# Patient Record
Sex: Female | Born: 2000
Health system: Southern US, Community
[De-identification: ages and names within clinical notes are randomized; demographics above are authoritative.]

## PROBLEM LIST (undated history)

## (undated) HISTORY — PX: WISDOM TOOTH EXTRACTION: SHX21

---
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
## 2021-03-03 ENCOUNTER — Ambulatory Visit: Payer: Commercial Managed Care - PPO | Admitting: Adult Health

## 2021-03-03 ENCOUNTER — Other Ambulatory Visit (HOSPITAL_COMMUNITY)
Admission: RE | Admit: 2021-03-03 | Discharge: 2021-03-03 | Disposition: A | Discharge status: Home / Self Care | Payer: 59 | Source: Ambulatory Visit | Attending: Adult Health | Admitting: Adult Health

## 2021-03-03 ENCOUNTER — Encounter: Payer: Self-pay | Admitting: Adult Health

## 2021-03-03 ENCOUNTER — Other Ambulatory Visit: Payer: Self-pay

## 2021-03-03 VITALS — BP 132/78 | HR 97 | Ht 64.5 in | Wt 157.8 lb

## 2021-03-03 DIAGNOSIS — Z3041 Encounter for surveillance of contraceptive pills: Secondary | ICD-10-CM

## 2021-03-03 DIAGNOSIS — N898 Other specified noninflammatory disorders of vagina: Secondary | ICD-10-CM

## 2021-03-03 DIAGNOSIS — Z113 Encounter for screening for infections with a predominantly sexual mode of transmission: Secondary | ICD-10-CM | POA: Insufficient documentation

## 2021-03-03 MED ORDER — FLUCONAZOLE 150 MG PO TABS: ORAL_TABLET | ORAL | 1 refills | Status: AC

## 2021-03-03 MED ORDER — NORETHIN ACE-ETH ESTRAD-FE 1-20 MG-MCG PO TABS: 1.0000 | ORAL_TABLET | Freq: Every day | ORAL | 3 refills | Status: DC

## 2021-03-03 NOTE — Progress Notes (Signed)
  Subjective:     Patient ID: Paula Simon, female   DOB: [birth year], 20 y.o.   MRN: [id number]  HPI Paula Simon is a 20 year old black female,single, G0P0, in complaining of vaginal discharge with some itching and ?rash in peri area, denies any new partners or products. She requests refills on OCs. PCP is Dr Mort Sawyers.  Review of Systems +vaginal discharge +vaginal itching +?rash in peri area Reviewed past medical,surgical, social and family history. Reviewed medications and allergies.     Objective:   Physical Exam BP 132/78 (BP Location: Right Arm, Patient Position: Sitting, Cuff Size: Normal)   Pulse 97   Ht 5' 4.5" (1.638 m)   Wt 157 lb 12.8 oz (71.6 kg)   LMP 02/25/2021 (Exact Date)   BMI 26.67 kg/m  Skin warm and dry. Lungs: clear to ausculation bilaterally. Cardiovascular: regular rate and rhythm. Pelvic: external genitalia is normal in appearance no lesions,but some white exudate at introitus, vagina: white discharge without odor,urethra has no lesions or masses noted, cervix:smooth, CV swab obtained. uterus: normal size, shape and contour, non tender, no masses felt, adnexa: no masses or tenderness noted. Bladder is non tender and no masses felt. Fall risk is low  Upstream - 03/03/21 1120       Pregnancy Intention Screening   Does the patient want to become pregnant in the next year? No    Does the patient's partner want to become pregnant in the next year? No    Would the patient like to discuss contraceptive options today? No      Contraception Wrap Up   Current Method Oral Contraceptive    End Method Oral Contraceptive    Contraception Counseling Provided No               Examination chaperoned by Malachy Mood LPN     Assessment:     1. Vaginal discharge   2. Vaginal itching Will rx diflucan Meds ordered this encounter  Medications   fluconazole (DIFLUCAN) 150 MG tablet    Sig: Take 1 now and 1 in 3 days    Dispense:  2 tablet    Refill:  1    Order  Specific Question:   Supervising Provider    Answer:   Despina Hidden, LUTHER H [[id number]]   norethindrone-ethinyl estradiol-FE (LOESTRIN FE) 1-20 MG-MCG tablet    Sig: Take 1 tablet by mouth daily.    Dispense:  84 tablet    Refill:  3    Order Specific Question:   Supervising Provider    Answer:   Despina Hidden, LUTHER H [[id number]]     3. Screening examination for STD (sexually transmitted disease) CV sent for GC/CHL, trich ,BV and yeast She declines labs today   4. Encounter for surveillance of contraceptive pills Will refill Loestrin 1/20     Plan:     Physical in 1 year

## 2021-03-06 ENCOUNTER — Other Ambulatory Visit: Payer: Self-pay | Admitting: Adult Health

## 2021-03-06 LAB — CERVICOVAGINAL ANCILLARY ONLY
Bacterial Vaginitis (gardnerella): NEGATIVE
Candida Glabrata: NEGATIVE
Candida Vaginitis: POSITIVE — AB
Chlamydia: NEGATIVE
Comment: NEGATIVE
Comment: NEGATIVE
Comment: NEGATIVE
Comment: NEGATIVE
Comment: NEGATIVE
Comment: NORMAL
Neisseria Gonorrhea: NEGATIVE
Trichomonas: NEGATIVE

## 2021-03-06 NOTE — Progress Notes (Signed)
+  yeast on vaginal swab already treated with diflucan

## 2022-03-19 ENCOUNTER — Other Ambulatory Visit: Payer: Self-pay | Admitting: Adult Health

## 2023-02-14 ENCOUNTER — Other Ambulatory Visit: Payer: Self-pay | Admitting: Adult Health

## 2023-05-09 ENCOUNTER — Other Ambulatory Visit: Payer: Self-pay | Admitting: Adult Health

## 2023-08-01 ENCOUNTER — Other Ambulatory Visit: Payer: Self-pay | Admitting: Adult Health

## 2023-10-24 ENCOUNTER — Other Ambulatory Visit: Payer: Self-pay | Admitting: Adult Health

## 2024-01-19 ENCOUNTER — Other Ambulatory Visit: Payer: Self-pay | Admitting: Adult Health

## 2024-04-30 ENCOUNTER — Ambulatory Visit: Admitting: Adult Health
